# Patient Record
Sex: Male | Born: 2000 | Hispanic: No | Marital: Single | Smoking: Never smoker
Health system: Southern US, Community
[De-identification: ages and names within clinical notes are randomized; demographics above are authoritative.]

---

## 2019-02-18 ENCOUNTER — Encounter (HOSPITAL_COMMUNITY): Payer: Self-pay | Admitting: Emergency Medicine

## 2019-02-18 ENCOUNTER — Emergency Department (HOSPITAL_COMMUNITY): Payer: Medicaid Other

## 2019-02-18 ENCOUNTER — Emergency Department (HOSPITAL_COMMUNITY)
Admission: EM | Admit: 2019-02-18 | Discharge: 2019-02-18 | Disposition: A | Discharge status: Home / Self Care | Payer: Medicaid Other | Attending: Emergency Medicine | Admitting: Emergency Medicine

## 2019-02-18 ENCOUNTER — Other Ambulatory Visit: Payer: Self-pay

## 2019-02-18 DIAGNOSIS — K219 Gastro-esophageal reflux disease without esophagitis: Secondary | ICD-10-CM | POA: Diagnosis not present

## 2019-02-18 DIAGNOSIS — R0789 Other chest pain: Secondary | ICD-10-CM

## 2019-02-18 LAB — CBC
HCT: 47.8 % (ref 39.0–52.0)
Hemoglobin: 15.5 g/dL (ref 13.0–17.0)
MCH: 29.3 pg (ref 26.0–34.0)
MCHC: 32.4 g/dL (ref 30.0–36.0)
MCV: 90.4 fL (ref 80.0–100.0)
Platelets: 241 10*3/uL (ref 150–400)
RBC: 5.29 MIL/uL (ref 4.22–5.81)
RDW: 13.3 % (ref 11.5–15.5)
WBC: 11.6 10*3/uL — ABNORMAL HIGH (ref 4.0–10.5)
nRBC: 0 % (ref 0.0–0.2)

## 2019-02-18 LAB — BASIC METABOLIC PANEL
Anion gap: 11 (ref 5–15)
BUN: 8 mg/dL (ref 6–20)
CO2: 26 mmol/L (ref 22–32)
Calcium: 9.6 mg/dL (ref 8.9–10.3)
Chloride: 101 mmol/L (ref 98–111)
Creatinine, Ser: 1.03 mg/dL (ref 0.61–1.24)
GFR calc Af Amer: >60 mL/min (ref >60)
GFR calc non Af Amer: >60 mL/min (ref >60)
Glucose, Bld: 88 mg/dL (ref 70–99)
Potassium: 3.9 mmol/L (ref 3.5–5.1)
Sodium: 138 mmol/L (ref 135–145)

## 2019-02-18 LAB — TROPONIN I (HIGH SENSITIVITY)
Troponin I (High Sensitivity): 2 ng/L (ref <18)
Troponin I (High Sensitivity): <2 ng/L (ref <18)

## 2019-02-18 MED ORDER — FAMOTIDINE 20 MG PO TABS: 20.0000 mg | ORAL_TABLET | Freq: Two times a day (BID) | ORAL | 0 refills | Status: AC

## 2019-02-18 MED ORDER — SODIUM CHLORIDE 0.9% FLUSH: 3.0000 mL | Freq: Once | INTRAVENOUS | Status: DC

## 2019-02-18 NOTE — Discharge Instructions (Signed)
Take pepcid as prescribed.  Follow-up with primary care provider to establish care.  If you develop exertional chest pain, chest pain worse with breathing, coughing up blood please seek reevaluation.

## 2019-02-18 NOTE — ED Triage Notes (Signed)
Pt in with c/o central cp that wakes him every morning. States pain worse with breathing, position changes and laughing. Denies sob, cough or n/v

## 2019-02-18 NOTE — ED Provider Notes (Signed)
[facility] EMERGENCY DEPARTMENT Provider Note   CSN: [id number] Arrival date & time: 02/18/19  1439    History   Chief Complaint Chief Complaint  Patient presents with  . Chest Pain    HPI Bruce Church is a 18 y.o. male with past medical history who presents for evaluation of chest pain.  Patient states he has had intermittent chest pain over the last 2 weeks.  Patient states the pain is present when he wakes up in the morning.  Patient states that when he belches this resolves the pain.  Patient states sometimes if he gets into a "weird position" he will develop chest pain however this resolves when he moves.  Denies any exertional or pleuritic chest pain.  Denies fever, chills, nausea, vomiting, radiation of chest pain, hemoptysis, shortness of breath, cough, abdominal pain, diarrhea, dysuria.  Denies prior history of diabetes, hypertension, hyperlipidemia, family history of MI at early age.  Patient states he notices when he eats heavy foods, dairy he wakes up with the pain more frequently.  Pain change with sitting and leaning forward.  Denies additional rating or alleviating factors.  Patient states he has not had chest pain since 7 AM this morning when he woke up.  Denies any recent syncope, family history of sudden cardiac death. Denies additional aggravating or alleviating factors.  History obtained from patient and past medical record.  No interpreter is used.     HPI  History reviewed. No pertinent past medical history.  There are no active problems to display for this patient.   History reviewed. No pertinent surgical history.      Home Medications    Prior to Admission medications   Medication Sig Start Date End Date Taking? Authorizing Provider  famotidine (PEPCID) 20 MG tablet Take 1 tablet (20 mg total) by mouth 2 (two) times daily. 02/18/19   Mehki Klumpp A, PA-C    Family History No family history on file.  Social History  Social History   Tobacco Use  . Smoking status: Never Smoker  . Smokeless tobacco: Never Used  Substance Use Topics  . Alcohol use: Never    Frequency: Never  . Drug use: Never     Allergies   Patient has no allergy information on record.   Review of Systems Review of Systems  Constitutional: Negative.   HENT: Negative.   Respiratory: Negative.   Cardiovascular: Positive for chest pain. Negative for palpitations and leg swelling.  Gastrointestinal: Negative.   Genitourinary: Negative.   Musculoskeletal: Negative.   Skin: Negative.   Neurological: Negative.   All other systems reviewed and are negative.    Physical Exam Updated Vital Signs BP (!) 106/92 (BP Location: Right Arm)   Pulse 93   Temp 98.6 F (37 C) (Oral)   Resp 16   Wt 88.5 kg   SpO2 100%   Physical Exam Vitals signs and nursing note reviewed.  Constitutional:      General: He is not in acute distress.    Appearance: He is not ill-appearing, toxic-appearing or diaphoretic.  HENT:     Head: Normocephalic and atraumatic.     Jaw: There is normal jaw occlusion.     Nose: Nose normal.  Eyes:     Extraocular Movements: Extraocular movements intact.  Neck:     Musculoskeletal: Full passive range of motion without pain, normal range of motion and neck supple.     Vascular: Normal carotid pulses. No carotid bruit or JVD.  Trachea: Trachea and phonation normal.     Meningeal: Brudzinski's sign and Kernig's sign absent.     Comments: No neck pain or neck stiffness.  No rigidity. Cardiovascular:     Rate and Rhythm: Normal rate.     Pulses: Normal pulses.          Radial pulses are 2+ on the right side and 2+ on the left side.       Posterior tibial pulses are 2+ on the right side and 2+ on the left side.     Heart sounds: Normal heart sounds.  Pulmonary:     Effort: Pulmonary effort is normal.     Breath sounds: Normal breath sounds and air entry.     Comments: Speaks in full sentences without  difficulty.  Lungs clear to auscultation bilaterally. Chest:     Chest wall: No deformity, swelling, tenderness, crepitus or edema.     Comments: No chest wall tenderness palpation, crepitus, step-offs, overlying skin changes. Musculoskeletal:     Right lower leg: No edema.     Left lower leg: No edema.     Comments: Moves all 4 extremities without difficulty.  Denna HaggardHomans' sign negative.  No unilateral calf redness, swelling, warmth, tenderness.  Feet:     Right foot:     Skin integrity: Skin integrity normal.     Left foot:     Skin integrity: Skin integrity normal.  Skin:    General: Skin is warm.     Capillary Refill: Capillary refill takes less than 2 seconds.  Neurological:     General: No focal deficit present.     Mental Status: He is alert and oriented to person, place, and time.      ED Treatments / Results  Labs (all labs ordered are listed, but only abnormal results are displayed) Labs Reviewed  CBC - Abnormal; Notable for the following components:      Result Value   WBC 11.6 (*)    All other components within normal limits  BASIC METABOLIC PANEL  TROPONIN I (HIGH SENSITIVITY)  TROPONIN I (HIGH SENSITIVITY)    EKG EKG Interpretation  Date/Time:  Tuesday February 18 2019 14:55:56 EDT Ventricular Rate:  81 PR Interval:  142 QRS Duration: 86 QT Interval:  332 QTC Calculation: 385 R Axis:   76 Text Interpretation:  Unusual P axis, possible ectopic atrial rhythm Abnormal ECG Confirmed by Virgina NorfolkAdam, [place] [phone]([id number]) on 02/18/2019 4:39:44 PM   Radiology Dg Chest 2 View  Result Date: 02/18/2019 CLINICAL DATA:  Chest pain EXAM: CHEST - 2 VIEW COMPARISON:  None. FINDINGS: Lungs are clear. Heart size and pulmonary vascularity are normal. No adenopathy. No pneumothorax. No bone lesions. IMPRESSION: No edema or consolidation. Electronically Signed   By: Bretta BangWilliam  Woodruff III M.D.   On: 02/18/2019 15:33    Procedures Procedures (including critical care time)   Medications Ordered in ED Medications  sodium chloride flush (NS) 0.9 % injection 3 mL (has no administration in time range)    Initial Impression / Assessment and Plan / ED Course  I have reviewed the triage vital signs and the nursing notes.  Pertinent labs & imaging results that were available during my care of the patient were reviewed by me and considered in my medical decision making (see chart for details).  18 year old male appears otherwise well presents for evaluation of chest pain.  He is afebrile, nonseptic, non-ill-appearing.  Heart score 0.  Pain improves with belching.  Will occasionally have pain  with movement.  No exertional pleuritic chest pain.  He is PERC negative, Wells criteria low risk.  No evidence of DVT on exam.  EKG without ST/T changes.  No evidence of Brugada, long QT.  No family history of sudden cardiac death, MI at early age, dissection.  Denies prior history of diabetes, hypertension, hyperlipidemia.  Them seem more related to reflux given they resolved with belching.  Will DC home with Pepcid.  Have also referred him outpatient to primary care.  I have low suspicion for ACS, PE, dissection, Boerhaave, myocarditis, pericarditis, infectious etiology.  Labs, imaging, EKG obtained from triage. Personally reviewed. Troponin II.  Given pain began greater than 12 hours ago I do not feel patient needs repeat delta troponin at this time.  If likely ACS in nature this would be elevated currently. Plain film chest negative without infiltrates, cardiomegaly, pulmonary edema, pneumothorax. Metabolic panel without electrolyte, renal, liver abnormality CBC with stable hemoglobin at 15.5.  Patient without tachycardia, tachypnea or hypoxia.  Patient is to be discharged with recommendation to follow up with PCP in regards to today's hospital visit. Chest pain is not likely of cardiac or pulmonary etiology d/t presentation, PERC negative, VSS, no tracheal deviation, no JVD or new  murmur, RRR, breath sounds equal bilaterally, EKG without acute abnormalities, negative troponin, and negative CXR. Pt has been advised to return to the ED if CP becomes exertional, associated with diaphoresis or nausea, radiates to left jaw/arm, worsens or becomes concerning in any way. Pt appears reliable for follow up and is agreeable to discharge.         Final Clinical Impressions(s) / ED Diagnoses   Final diagnoses:  Atypical chest pain  Gastroesophageal reflux disease, unspecified whether esophagitis present    ED Discharge Orders         Ordered    famotidine (PEPCID) 20 MG tablet  2 times daily     02/18/19 1701           Zahari Xiang A, PA-C 02/18/19 [place], Adam, DO 02/19/19 0128

## 2020-07-05 ENCOUNTER — Emergency Department (HOSPITAL_COMMUNITY)
Admission: EM | Admit: 2020-07-05 | Discharge: 2020-07-06 | Disposition: A | Discharge status: Home / Self Care | Payer: Medicaid Other | Attending: Emergency Medicine | Admitting: Emergency Medicine

## 2020-07-05 ENCOUNTER — Emergency Department (HOSPITAL_COMMUNITY): Payer: Medicaid Other

## 2020-07-05 ENCOUNTER — Encounter (HOSPITAL_COMMUNITY): Payer: Self-pay

## 2020-07-05 ENCOUNTER — Other Ambulatory Visit: Payer: Self-pay

## 2020-07-05 DIAGNOSIS — S6991XA Unspecified injury of right wrist, hand and finger(s), initial encounter: Secondary | ICD-10-CM | POA: Diagnosis present

## 2020-07-05 DIAGNOSIS — Z23 Encounter for immunization: Secondary | ICD-10-CM | POA: Diagnosis not present

## 2020-07-05 DIAGNOSIS — S61210A Laceration without foreign body of right index finger without damage to nail, initial encounter: Secondary | ICD-10-CM | POA: Insufficient documentation

## 2020-07-05 DIAGNOSIS — W268XXA Contact with other sharp object(s), not elsewhere classified, initial encounter: Secondary | ICD-10-CM | POA: Diagnosis not present

## 2020-07-05 MED ORDER — LIDOCAINE HCL (PF) 1 % IJ SOLN
10.0000 mL | Freq: Once | INTRAMUSCULAR | Status: AC
  Administered 2020-07-05: 10 mL
  Filled 2020-07-05: qty 10

## 2020-07-05 MED ORDER — TETANUS-DIPHTH-ACELL PERTUSSIS 5-2.5-18.5 LF-MCG/0.5 IM SUSY
0.5000 mL | PREFILLED_SYRINGE | Freq: Once | INTRAMUSCULAR | Status: AC
  Administered 2020-07-05: 0.5 mL via INTRAMUSCULAR
  Filled 2020-07-05: qty 0.5

## 2020-07-05 NOTE — ED Provider Notes (Signed)
[facility] EMERGENCY DEPARTMENT Provider Note   CSN: [id number] Arrival date & time: 07/05/20  2152     History Chief Complaint  Patient presents with  . Finger Injury    Bruce Church is a 20 y.o. male without significant past medical hx who presents to the ED with complaints of right index finger injury which occurred earlier this evening.  Patient states that he accidentally cut his right index finger when opening a can of dog food.  The area is uncomfortable, no alleviating or aggravating factors, no intervention prior to arrival.  Unknown last tetanus.  No other areas of injury.  Patient is ambidextrous.  Denies numbness, tingling, weakness, fever, or chills.   HPI     History reviewed. No pertinent past medical history.  There are no problems to display for this patient.   History reviewed. No pertinent surgical history.     No family history on file.  Social History   Tobacco Use  . Smoking status: Never Smoker  . Smokeless tobacco: Never Used  Vaping Use  . Vaping Use: Every day  Substance Use Topics  . Alcohol use: Never  . Drug use: Never    Home Medications Prior to Admission medications   Medication Sig Start Date End Date Taking? Authorizing Provider  famotidine (PEPCID) 20 MG tablet Take 1 tablet (20 mg total) by mouth 2 (two) times daily. 02/18/19   Henderly, Britni A, PA-C    Allergies    Patient has no known allergies.  Review of Systems   Review of Systems  Constitutional: Negative for chills and fever.  Respiratory: Negative for cough and shortness of breath.   Cardiovascular: Negative for chest pain.  Gastrointestinal: Negative for abdominal pain and vomiting.  Musculoskeletal: Positive for arthralgias.  Skin: Positive for wound.  Neurological: Negative for weakness and numbness.  All other systems reviewed and are negative.   Physical Exam Updated Vital Signs BP 124/64 (BP Location: Right Arm)   Pulse (!)  57   Temp 98.2 F (36.8 C) (Oral)   Resp 12   SpO2 100%   Physical Exam Vitals and nursing note reviewed.  Constitutional:      General: He is not in acute distress.    Appearance: Normal appearance. He is not ill-appearing or toxic-appearing.  HENT:     Head: Normocephalic and atraumatic.  Neck:     Comments: No midline tenderness.  Cardiovascular:     Rate and Rhythm: Normal rate.     Pulses:          Radial pulses are 2+ on the right side and 2+ on the left side.  Pulmonary:     Effort: No respiratory distress.     Breath sounds: Normal breath sounds.  Musculoskeletal:     Cervical back: Normal range of motion and neck supple.     Comments: Upper extremities: Patient has  3.5 cm laceration to the palmar/radial aspect of the right index finger @ the distal phalanx (palmar)/DIP joint (radial). 2-3 mm deep. No active bleeding. No appreciable FB. No obvious deformity, appreciable swelling, or ecchymosis.  Patient has intact AROM throughout.  Tender to palpation over laceration otherwise nontender.   Skin:    General: Skin is warm and dry.     Capillary Refill: Capillary refill takes less than 2 seconds.  Neurological:     Mental Status: He is alert.     Comments: Alert. Clear speech. Sensation grossly intact to bilateral upper extremities. 5/5  symmetric grip strength. Ambulatory.  Able to perform okay sign, thumbs up and cross second/third digits bilaterally.  Patient is able to flex and extend the MCP and IP joints of the right index finger against resistance.  Psychiatric:        Mood and Affect: Mood normal.        Behavior: Behavior normal.     ED Results / Procedures / Treatments   Labs (all labs ordered are listed, but only abnormal results are displayed) Labs Reviewed - No data to display  EKG None  Radiology DG Finger Index Right  Result Date: 07/05/2020 CLINICAL DATA:  Second digit laceration EXAM: RIGHT INDEX FINGER 2+V COMPARISON:  None. FINDINGS: Soft tissue  laceration is noted adjacent to the DIP joint. No acute bony abnormality is noted. No foreign body is seen. IMPRESSION: Second digit laceration without acute bony abnormality. Electronically Signed   By: Alcide Clever M.D.   On: 07/05/2020 22:21    Procedures .Marland KitchenLaceration Repair  Date/Time: 07/06/2020 12:04 AM Performed by: Cherly Anderson, PA-C Authorized by: Cherly Anderson, PA-C   Consent:    Consent obtained:  Verbal   Consent given by:  Patient   Risks, benefits, and alternatives were discussed: yes     Risks discussed:  Infection, need for additional repair, nerve damage, poor wound healing, poor cosmetic result, pain, retained foreign body, tendon damage and vascular damage   Alternatives discussed:  No treatment Anesthesia:    Anesthesia method:  Nerve block   Block location:  Right index finger   Block needle gauge:  25 G   Block anesthetic:  Lidocaine 1% w/o epi   Block injection procedure:  Anatomic landmarks identified, anatomic landmarks palpated, negative aspiration for blood, introduced needle and incremental injection   Block outcome:  Anesthesia achieved Laceration details:    Location:  Finger   Finger location:  R index finger   Length (cm):  3.5   Depth (mm):  3 Pre-procedure details:    Preparation:  Patient was prepped and draped in usual sterile fashion and imaging obtained to evaluate for foreign bodies Exploration:    Hemostasis achieved with:  Direct pressure   Imaging obtained: x-ray     Imaging outcome: foreign body not noted     Wound exploration: wound explored through full range of motion and entire depth of wound visualized     Wound extent: no tendon damage noted   Treatment:    Area cleansed with:  Povidone-iodine   Amount of cleaning:  Standard   Irrigation solution:  Sterile water   Irrigation volume:  700 cc   Irrigation method:  Pressure wash   Visualized foreign bodies/material removed: no   Skin repair:    Repair method:   Sutures   Suture size:  4-0   Suture material:  Nylon   Suture technique:  Simple interrupted   Number of sutures:  5 Approximation:    Approximation:  Close Repair type:    Repair type:  Simple Post-procedure details:    Dressing:  Splint for protection and adhesive bandage   Procedure completion:  Tolerated well, no immediate complications     Medications Ordered in ED Medications  lidocaine (PF) (XYLOCAINE) 1 % injection 10 mL (10 mLs Infiltration Given by Other 07/05/20 2332)  Tdap (BOOSTRIX) injection 0.5 mL (0.5 mLs Intramuscular Given 07/05/20 2332)    ED Course  I have reviewed the triage vital signs and the nursing notes.  Pertinent labs & imaging  results that were available during my care of the patient were reviewed by me and considered in my medical decision making (see chart for details).    MDM Rules/Calculators/A&P                          Patient presents to the emergency department with laceration to right index finger which occurred within 8 hours PTA . Patient nontoxic appearing, resting comfortably. X-ray obtained per traige in area of laceration, no fractures/dislocations or apparent radiopaque foreign bodies noted- personally reviewed/ interpreted imaging- agree with radiology read. Pressure irrigation performed. Wound explored and base of wound visualized in a bloodless field without evidence of foreign body. NVI distally. Laceration repair per procedure note above, tolerated well. Tetanus updated at today's visit. Able to flex/extend the digit against resistance prior to and following procedure- not consistent w/ tendon injury. Remains with < 2 second cap refill following procedure. Appears appropriate for discharge home.  Discussed suture home care as well as need for wound recheck and suture removal in 7 days.  I discussed results, treatment plan, need for follow-up, and return precautions with the patient including signs of infection. Provided opportunity for  questions, patient confirmed understanding and is in agreement with plan.    Final Clinical Impression(s) / ED Diagnoses Final diagnoses:  Laceration of right index finger without foreign body without damage to nail, initial encounter    Rx / DC Orders ED Discharge Orders    None       Cherly Anderson, PA-C 07/06/20 0012    Mesner, Barbara Cower, MD 07/06/20 [phone]

## 2020-07-05 NOTE — ED Triage Notes (Signed)
Pt reports that he cut his R index finger on a can, last tetanus unknown

## 2020-07-06 NOTE — Discharge Instructions (Addendum)
You were seen in the emergency department today for a laceration. Your laceration was closed with 5 stitches. Please keep this area clean and dry for the next 24 hours, after 24 hours you may get this area wet, but avoid soaking the area. Keep the area covered as best possible especially when in the sun to help in minimizing scarring.   Your tetanus has been updated   Please wear the splint the next 3 days to help the wound heal.   You will need to have the stitches removed and the wound rechecked in 7 days. Please return to the emergency department, go to an urgent care, or see your primary care provider to have this performed. Return to the ER soon should you start to experience pus type drainage from the wound, redness around the wound, or fevers as this could indicate the area is infected, please return to the ER for any other worsening symptoms or concerns that you may have.

## 2020-07-06 NOTE — ED Notes (Signed)
Xeroform, clingy and finger splint applied prior to discharge

## 2022-07-30 IMAGING — CR DG FINGER INDEX 2+V*R*
3 series · 3 of 3 positions shown · non-contrast
Comparison: None.

CLINICAL DATA: Second digit laceration

EXAM:
RIGHT INDEX FINGER 2+V

[finger ap]
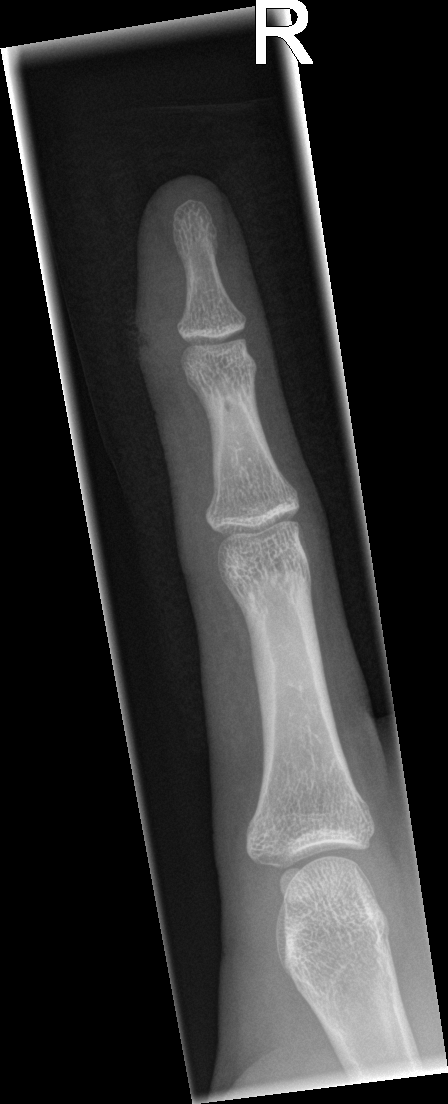

[finger obl]
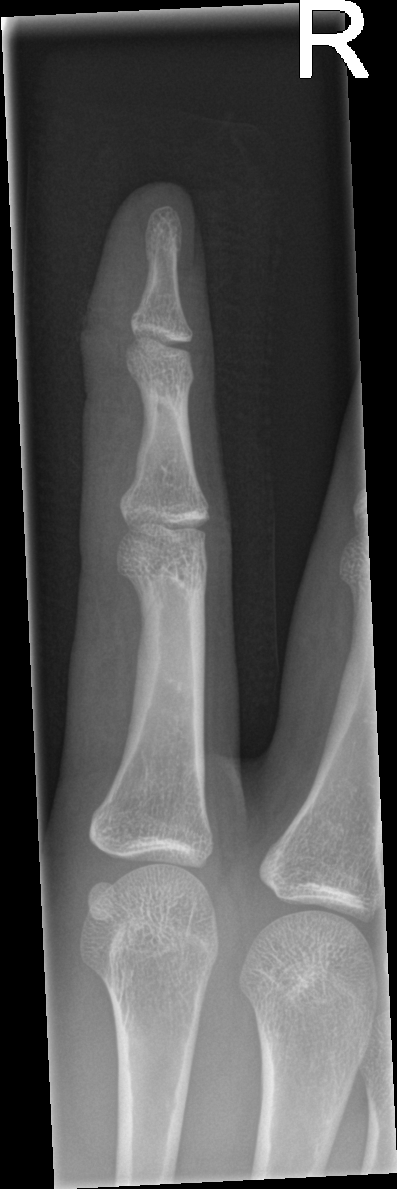

[finger lat]
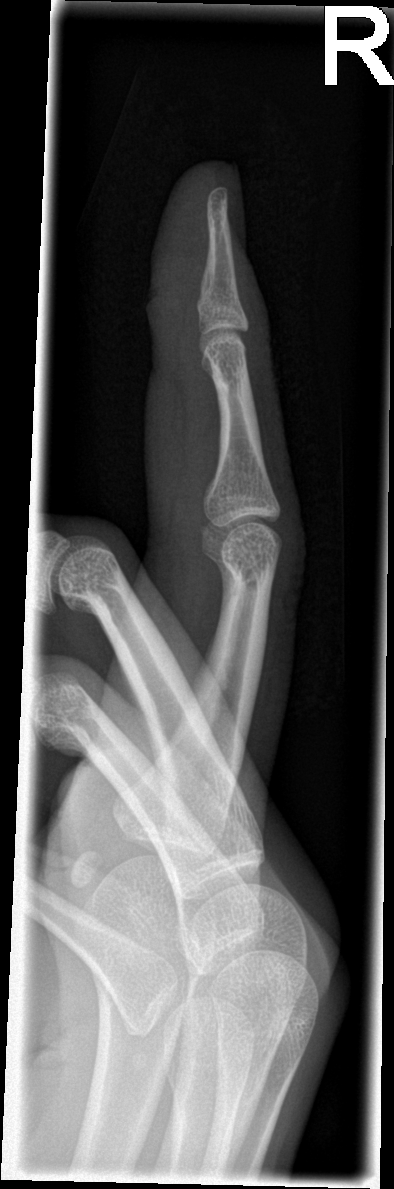

[3 of 3 positions shown; findings below may reference images not displayed]

FINDINGS: Soft tissue laceration is noted adjacent to the DIP joint. No acute
bony abnormality is noted. No foreign body is seen.
IMPRESSION: Second digit laceration without acute bony abnormality.
